# Patient Record
Sex: Female | Born: 1940 | Race: White | Hispanic: No | Marital: Married | Smoking: Never smoker
Health system: Southern US, Community
[De-identification: ages and names within clinical notes are randomized; demographics above are authoritative.]

## PROBLEM LIST (undated history)

## (undated) DIAGNOSIS — E079 Disorder of thyroid, unspecified: Secondary | ICD-10-CM

## (undated) DIAGNOSIS — I10 Essential (primary) hypertension: Secondary | ICD-10-CM

---

## 2013-10-06 ENCOUNTER — Encounter: Payer: Self-pay | Admitting: Emergency Medicine

## 2013-10-06 ENCOUNTER — Emergency Department
Admission: EM | Admit: 2013-10-06 | Discharge: 2013-10-06 | Disposition: A | Discharge status: Home / Self Care | Payer: Medicare HMO | Source: Home / Self Care | Attending: Emergency Medicine | Admitting: Emergency Medicine

## 2013-10-06 ENCOUNTER — Emergency Department (INDEPENDENT_AMBULATORY_CARE_PROVIDER_SITE_OTHER): Payer: Commercial Managed Care - HMO

## 2013-10-06 DIAGNOSIS — R0989 Other specified symptoms and signs involving the circulatory and respiratory systems: Secondary | ICD-10-CM

## 2013-10-06 DIAGNOSIS — R05 Cough: Secondary | ICD-10-CM

## 2013-10-06 DIAGNOSIS — J0100 Acute maxillary sinusitis, unspecified: Secondary | ICD-10-CM

## 2013-10-06 DIAGNOSIS — R059 Cough, unspecified: Secondary | ICD-10-CM

## 2013-10-06 DIAGNOSIS — J209 Acute bronchitis, unspecified: Secondary | ICD-10-CM

## 2013-10-06 HISTORY — DX: Essential (primary) hypertension: I10

## 2013-10-06 HISTORY — DX: Disorder of thyroid, unspecified: E07.9

## 2013-10-06 MED ORDER — PROMETHAZINE-CODEINE 6.25-10 MG/5ML PO SYRP: ORAL_SOLUTION | ORAL | Status: DC

## 2013-10-06 MED ORDER — AZITHROMYCIN 250 MG PO TABS: ORAL_TABLET | ORAL | Status: DC

## 2013-10-06 MED ORDER — METHYLPREDNISOLONE ACETATE 80 MG/ML IJ SUSP
80.0000 mg | Freq: Once | INTRAMUSCULAR | Status: AC
  Administered 2013-10-06: 80 mg via INTRAMUSCULAR

## 2013-10-06 MED ORDER — CEFTRIAXONE SODIUM 1 G IJ SOLR
1.0000 g | INTRAMUSCULAR | Status: AC
  Administered 2013-10-06: 1 g via INTRAMUSCULAR

## 2013-10-06 MED ORDER — FLUTICASONE PROPIONATE 50 MCG/ACT NA SUSP: NASAL | Status: AC

## 2013-10-06 NOTE — ED Provider Notes (Signed)
CSN: [id number]     Arrival date & time 10/06/13  1237 History   First MD Initiated Contact with Patient 10/06/13 1256     Chief Complaint  Patient presents with  . Sinus Problem   HPI Sinus pain, pressure, runny nose, cough, wheezing, sinus headache x 10 days. Does not smoke but lives in a smoking household.   Have been using over-the-counter treatment which helps a little bit.  No chills/sweats +  Low-grade Fever  +  Nasal congestion +  Discolored Post-nasal drainage Moderate to severe bilateral sinus pain/pressure Mild sore throat  +  Hacking cough, productive of clear sputum with occasional green sputum  + Mild wheezing Positive chest congestion No hemoptysis No shortness of breath No pleuritic pain  No itchy/red eyes No earache  No nausea No vomiting No abdominal pain No diarrhea  No skin rashes +  Fatigue + myalgias +sinus headache   Past Medical History  Diagnosis Date  . Thyroid disease   . Hypertension    History reviewed. No pertinent past surgical history. No family history on file. History  Substance Use Topics  . Smoking status: Passive Smoke Exposure - Never Smoker  . Smokeless tobacco: Not on file  . Alcohol Use: Yes   OB History   Grav Para Term Preterm Abortions TAB SAB Ect Mult Living                 Review of Systems  All other systems reviewed and are negative.   Allergies  Review of patient's allergies indicates no known allergies.  Home Medications   Prior to Admission medications   Medication Sig Start Date End Date Taking? Authorizing Provider  cetirizine (ZYRTEC) 10 MG tablet Take 10 mg by mouth daily.   Yes Historical Provider, MD  levothyroxine (SYNTHROID, LEVOTHROID) 50 MCG tablet Take 50 mcg by mouth daily before breakfast.   Yes Historical Provider, MD  montelukast (SINGULAIR) 10 MG tablet Take 10 mg by mouth at bedtime.   Yes Historical Provider, MD  azithromycin (ZITHROMAX Z-PAK) 250 MG tablet Take 2 tablets on day  one, then 1 tablet daily on days 2 through 5 10/06/13   Lajean Manes, MD  fluticasone Aleda Grana) 50 MCG/ACT nasal spray 1 or 2 sprays each nostril twice a day 10/06/13   Lajean Manes, MD  promethazine-codeine [facility] WITH CODEINE) 6.25-10 MG/5ML syrup Take 1 teaspoon at bedtime as needed for cough. May cause drowsiness. 10/06/13   Lajean Manes, MD   BP 140/82  Pulse 80  Temp(Src) 98.1 F (36.7 C) (Oral)  Ht  (1.575 m)  Wt 172 lb (78.019 kg)  BMI 31.45 kg/m2  SpO2 97% Physical Exam  Nursing note and vitals reviewed. Constitutional: She is oriented to person, place, and time. She appears well-developed and well-nourished. No distress.  HENT:  Head: Normocephalic and atraumatic.  Right Ear: Tympanic membrane, external ear and ear canal normal.  Left Ear: Tympanic membrane, external ear and ear canal normal.  Nose: Mucosal edema and rhinorrhea present. Right sinus exhibits maxillary sinus tenderness. Left sinus exhibits maxillary sinus tenderness.  Mouth/Throat: Oropharynx is clear and moist. No oral lesions. No oropharyngeal exudate.  Eyes: Right eye exhibits no discharge. Left eye exhibits no discharge. No scleral icterus.  Neck: Neck supple.  Cardiovascular: Normal rate, regular rhythm and normal heart sounds.   Pulmonary/Chest: Effort normal. She has no decreased breath sounds. She has wheezes (minimal). She has rhonchi (Diffuse). She has no rales.  Minimal bibasilar rales which clear  after coughing  Lymphadenopathy:    She has no cervical adenopathy.  Neurological: She is alert and oriented to person, place, and time. No cranial nerve deficit.  Skin: Skin is warm and dry.  Psychiatric: She has a normal mood and affect.    ED Course  Procedures (including critical care time) Labs Review Labs Reviewed - No data to display  Imaging Review Dg Chest 2 View  10/06/2013   CLINICAL DATA:  Cough and congestion.  EXAM: CHEST  2 VIEW  COMPARISON:  None.  FINDINGS: Lungs are clear.  Heart size is normal. No pneumothorax or pleural effusion. Surgical clips in the left upper quadrant of the abdomen are noted.  IMPRESSION: No acute disease.   Electronically Signed   By: Drusilla Kanner M.D.   On: 10/06/2013 13:47     MDM   1. Acute bronchitis with bronchospasm   2. Acute maxillary sinusitis, recurrence not specified     Likely has acute bronchitis and acute sinusitis. No significant wheezing at this time. Good oxygenation with O2 saturation 97% on room air. She is exposed to secondhand smoke and advised her to avoid secondhand smoke  Treatment options discussed, as well as risks, benefits, alternatives. Patient voiced understanding and agreement with the following plans: An After Visit Summary was printed and given to the patient. Tylenol as needed for pain or fever. Today, we gave you a shot of Depo-Medrol, which is a cortisone type medication. We also gave a shot of Rocephin, a strong antibiotic. Take the prescriptions as prescribed.--Z. PAK and Flonase and OTC Mucinex  May also take over-the-counter Mucinex twice a day. Today, chest x-ray shows bronchitis, but no pneumonia. Do not allow smoking in the house !  Acute Bronchitis Bronchitis is when the airways that extend from the windpipe into the lungs get red, puffy, and painful (inflamed). Bronchitis often causes thick spit (mucus) to develop. This leads to a cough. A cough is the most common symptom of bronchitis. In acute bronchitis, the condition usually begins suddenly and goes away over time (usually in 2 weeks). Smoking, allergies, and asthma can make bronchitis worse. Repeated episodes of bronchitis may cause more lung problems. HOME CARE  Rest.  Drink enough fluids to keep your pee (urine) clear or pale yellow (unless you need to limit fluids as told by your doctor).  Only take over-the-counter or prescription medicines as told by your doctor.  Avoid smoking and secondhand smoke. These can make  bronchitis worse. If you are a smoker, think about using nicotine gum or skin patches. Quitting smoking will help your lungs heal faster.  Reduce the chance of getting bronchitis again by:  Washing your hands often.  Avoiding people with cold symptoms.  Trying not to touch your hands to your mouth, nose, or eyes.  Follow up with your primary care doctor at [facility] family medicine within one week. GET HELP IF: Your symptoms do not improve after 1 week of treatment. Symptoms include:  Cough.  Fever.  Coughing up thick spit.  Body aches.  Chest congestion.  Chills.  Shortness of breath.  Sore throat. GET HELP RIGHT AWAY IF:   You have an increased fever.  You have chills.  You have severe shortness of breath.  You have bloody thick spit (sputum).  You throw up (vomit) often.  You lose too much body fluid (dehydration).  You have a severe headache.  You faint.  Precautions discussed. Red flags discussed. Questions invited and answered. Patient voiced  understanding and agreement.   Lajean Manes, MD 10/06/13 1434

## 2013-10-06 NOTE — Discharge Instructions (Signed)
Today, we gave you a shot of Depo-Medrol, which is a cortisone type medication. We also gave a shot of Rocephin, a strong antibiotic.  Take the prescriptions as prescribed. May also take over-the-counter Mucinex twice a day. Today, chest x-ray shows bronchitis, but no pneumonia.    Do not allow smoking in the house !  Acute Bronchitis Bronchitis is when the airways that extend from the windpipe into the lungs get red, puffy, and painful (inflamed). Bronchitis often causes thick spit (mucus) to develop. This leads to a cough. A cough is the most common symptom of bronchitis. In acute bronchitis, the condition usually begins suddenly and goes away over time (usually in 2 weeks). Smoking, allergies, and asthma can make bronchitis worse. Repeated episodes of bronchitis may cause more lung problems. HOME CARE  Rest.  Drink enough fluids to keep your pee (urine) clear or pale yellow (unless you need to limit fluids as told by your doctor).  Only take over-the-counter or prescription medicines as told by your doctor.  Avoid smoking and secondhand smoke. These can make bronchitis worse. If you are a smoker, think about using nicotine gum or skin patches. Quitting smoking will help your lungs heal faster.  Reduce the chance of getting bronchitis again by:  Washing your hands often.  Avoiding people with cold symptoms.  Trying not to touch your hands to your mouth, nose, or eyes.  Follow up with your primary care doctor at [facility] family medicine within one week. GET HELP IF: Your symptoms do not improve after 1 week of treatment. Symptoms include:  Cough.  Fever.  Coughing up thick spit.  Body aches.  Chest congestion.  Chills.  Shortness of breath.  Sore throat. GET HELP RIGHT AWAY IF:   You have an increased fever.  You have chills.  You have severe shortness of breath.  You have bloody thick spit (sputum).  You throw up (vomit) often.  You lose too much  body fluid (dehydration).  You have a severe headache.  You faint. MAKE SURE YOU:   Understand these instructions.  Will watch your condition.  Will get help right away if you are not doing well or get worse.

## 2013-10-06 NOTE — ED Notes (Signed)
Sinus pain, pressure, runny nose, cough, wheezing, sinus headache x 10 days. Does not smoke but lives in a smoking household.

## 2014-01-11 ENCOUNTER — Encounter: Payer: Self-pay | Admitting: *Deleted

## 2014-01-11 ENCOUNTER — Emergency Department
Admission: EM | Admit: 2014-01-11 | Discharge: 2014-01-11 | Disposition: A | Discharge status: Home / Self Care | Payer: Medicare HMO | Source: Home / Self Care | Attending: Family Medicine | Admitting: Family Medicine

## 2014-01-11 DIAGNOSIS — J069 Acute upper respiratory infection, unspecified: Secondary | ICD-10-CM

## 2014-01-11 DIAGNOSIS — J029 Acute pharyngitis, unspecified: Secondary | ICD-10-CM

## 2014-01-11 MED ORDER — PREDNISONE 10 MG PO TABS: 30.0000 mg | ORAL_TABLET | Freq: Every day | ORAL | Status: AC

## 2014-01-11 MED ORDER — AZITHROMYCIN 250 MG PO TABS: 250.0000 mg | ORAL_TABLET | Freq: Every day | ORAL | Status: AC

## 2014-01-11 MED ORDER — IPRATROPIUM BROMIDE 0.06 % NA SOLN: 2.0000 | Freq: Four times a day (QID) | NASAL | Status: AC

## 2014-01-11 NOTE — ED Provider Notes (Signed)
Nicole Fuentes is a 73 y.o. female who presents to Urgent Care today for cough congestion sinus pressure at discharge and sore throat associated with a headache. Symptoms present off and on for about 4 weeks. Patient has tried Zyrtec Singulair and Tylenol which has not worked well. No vomiting or diarrhea. She feels well otherwise. No chest pains or palpitations.   Past Medical History  Diagnosis Date  . Thyroid disease   . Hypertension    History reviewed. No pertinent past surgical history. History  Substance Use Topics  . Smoking status: Passive Smoke Exposure - Never Smoker  . Smokeless tobacco: Not on file  . Alcohol Use: Yes   ROS as above Medications: No current facility-administered medications for this encounter.   Current Outpatient Prescriptions  Medication Sig Dispense Refill  . azithromycin (ZITHROMAX) 250 MG tablet Take 1 tablet (250 mg total) by mouth daily. Take first 2 tablets together, then 1 every day until finished. 6 tablet 0  . cetirizine (ZYRTEC) 10 MG tablet Take 10 mg by mouth daily.    . fluticasone (FLONASE) 50 MCG/ACT nasal spray 1 or 2 sprays each nostril twice a day 16 g 0  . ipratropium (ATROVENT) 0.06 % nasal spray Place 2 sprays into both nostrils 4 (four) times daily. 15 mL 1  . levothyroxine (SYNTHROID, LEVOTHROID) 50 MCG tablet Take 50 mcg by mouth daily before breakfast.    . montelukast (SINGULAIR) 10 MG tablet Take 10 mg by mouth at bedtime.    . predniSONE (DELTASONE) 10 MG tablet Take 3 tablets (30 mg total) by mouth daily. 15 tablet 0   No Known Allergies   Exam:  BP 150/79 mmHg  Pulse 98  Temp(Src) 98.1 F (36.7 C) (Oral)  Resp 18  Wt 174 lb (78.926 kg)  SpO2 98% Gen: Well NAD HEENT: EOMI,  MMM posterior pharynx erythematous with cobblestoning. Normal tympanic membranes bilaterally. Lungs: Normal work of breathing. CTABL Heart: RRR no MRG Abd: NABS, Soft. Nondistended, Nontender Exts: Brisk capillary refill, warm and well  perfused.   No results found for this or any previous visit (from the past 24 hour(s)). No results found.  Assessment and Plan: 73 y.o. female with viral upper respiratory infection with pharyngitis and otitis. Plan to treat with prednisone and Atrovent nasal spray. Reserve azithromycin for use if not improved. Follow-up as needed.  Discussed warning signs or symptoms. Please see discharge instructions. Patient expresses understanding.     Rodolph BongEvan S Leiana Rund, MD 01/11/14 [phone]

## 2014-01-11 NOTE — Discharge Instructions (Signed)
Thank you for coming in today. Continue Zyrtec and Singulair. Use Tylenol every 6 hours for pain. Use Atrovent nasal spray as directed to reduce to postnasal drip. Take prednisone daily for 5 days to combat throat pain in her pain and nasal congestion. If not getting better take azithromycin antibiotics Call or go to the emergency room if you get worse, have trouble breathing, have chest pains, or palpitations.   Pharyngitis Pharyngitis is redness, pain, and swelling (inflammation) of your pharynx.  CAUSES  Pharyngitis is usually caused by infection. Most of the time, these infections are from viruses (viral) and are part of a cold. However, sometimes pharyngitis is caused by bacteria (bacterial). Pharyngitis can also be caused by allergies. Viral pharyngitis may be spread from person to person by coughing, sneezing, and personal items or utensils (cups, forks, spoons, toothbrushes). Bacterial pharyngitis may be spread from person to person by more intimate contact, such as kissing.  SIGNS AND SYMPTOMS  Symptoms of pharyngitis include:   Sore throat.   Tiredness (fatigue).   Low-grade fever.   Headache.  Joint pain and muscle aches.  Skin rashes.  Swollen lymph nodes.  Plaque-like film on throat or tonsils (often seen with bacterial pharyngitis). DIAGNOSIS  Your health care provider will ask you questions about your illness and your symptoms. Your medical history, along with a physical exam, is often all that is needed to diagnose pharyngitis. Sometimes, a rapid strep test is done. Other lab tests may also be done, depending on the suspected cause.  TREATMENT  Viral pharyngitis will usually get better in 3-4 days without the use of medicine. Bacterial pharyngitis is treated with medicines that kill germs (antibiotics).  HOME CARE INSTRUCTIONS   Drink enough water and fluids to keep your urine clear or pale yellow.   Only take over-the-counter or prescription medicines as  directed by your health care provider:   If you are prescribed antibiotics, make sure you finish them even if you start to feel better.   Do not take aspirin.   Get lots of rest.   Gargle with 8 oz of salt water ( tsp of salt per 1 qt of water) as often as every 1-2 hours to soothe your throat.   Throat lozenges (if you are not at risk for choking) or sprays may be used to soothe your throat. SEEK MEDICAL CARE IF:   You have large, tender lumps in your neck.  You have a rash.  You cough up green, yellow-brown, or bloody spit. SEEK IMMEDIATE MEDICAL CARE IF:   Your neck becomes stiff.  You drool or are unable to swallow liquids.  You vomit or are unable to keep medicines or liquids down.  You have severe pain that does not go away with the use of recommended medicines.  You have trouble breathing (not caused by a stuffy nose). MAKE SURE YOU:   Understand these instructions.  Will watch your condition.  Will get help right away if you are not doing well or get worse. Document Released: 12/29/2004 Document Revised: 10/19/2012 Document Reviewed: 09/05/2012 [facility] Patient Information 2015 [place], [place]. This information is not intended to replace advice given to you by your health care provider. Make sure you discuss any questions you have with your health care provider.

## 2015-09-14 IMAGING — CR DG CHEST 2V
2 series · 2 of 2 positions shown · non-contrast
Comparison: None.

CLINICAL DATA: Cough and congestion.

EXAM:
CHEST  2 VIEW

[view not recorded (1 of 2)]
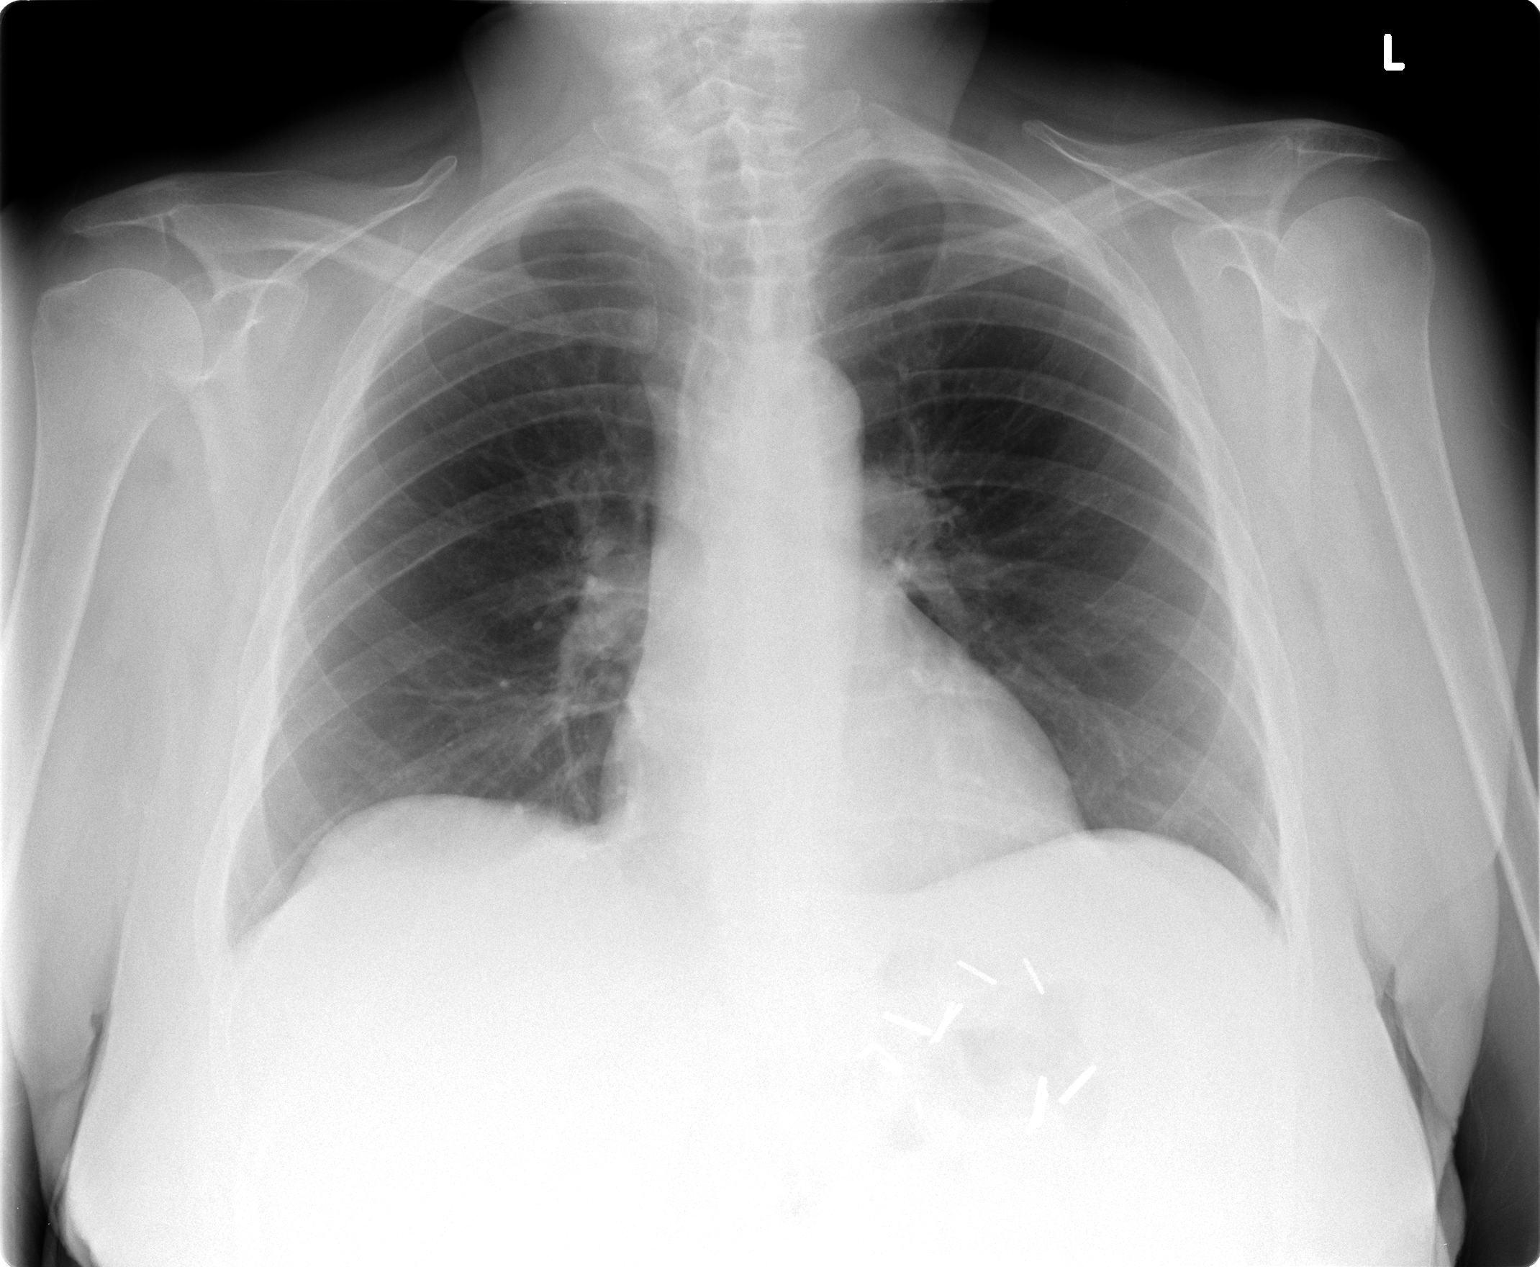

[view not recorded (2 of 2)]
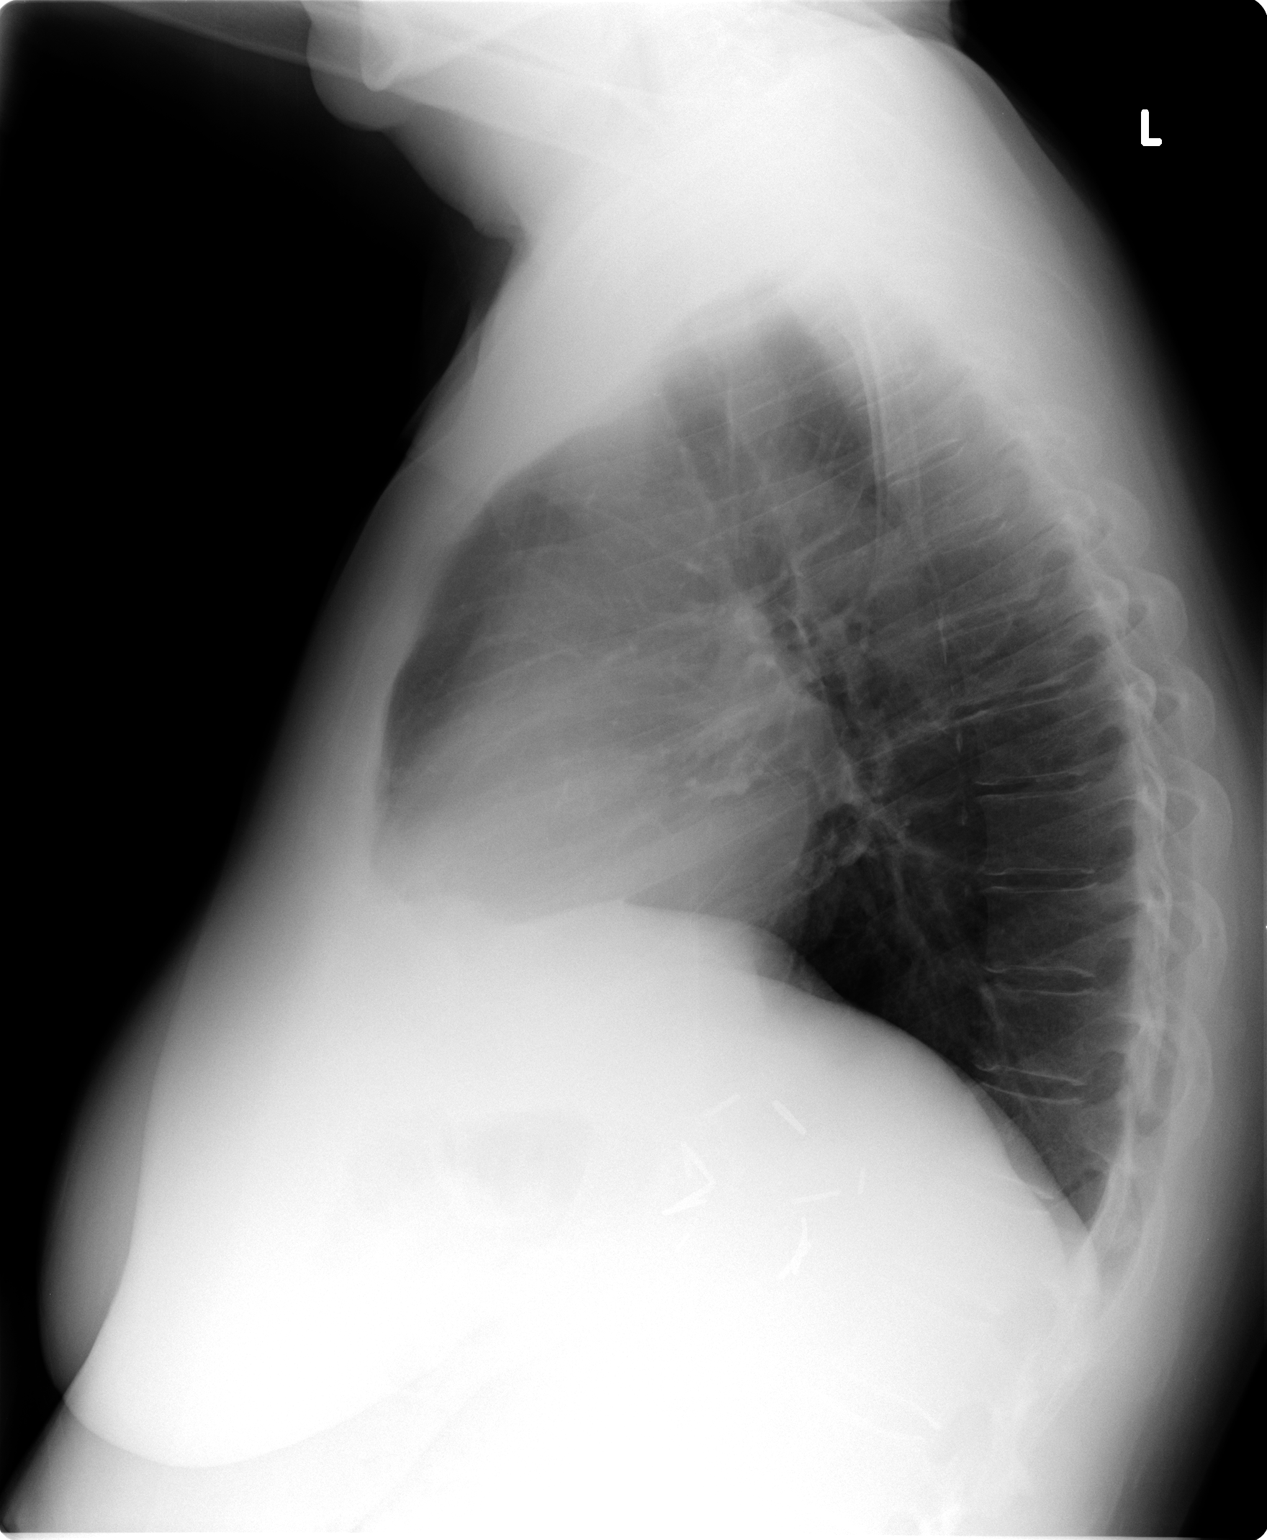

[2 of 2 positions shown; findings below may reference images not displayed]

FINDINGS: Lungs are clear. Heart size is normal. No pneumothorax or pleural
effusion. Surgical clips in the left upper quadrant of the abdomen
are noted.
IMPRESSION: No acute disease.

## 2021-01-12 ENCOUNTER — Emergency Department (INDEPENDENT_AMBULATORY_CARE_PROVIDER_SITE_OTHER)
Admission: EM | Admit: 2021-01-12 | Discharge: 2021-01-12 | Disposition: A | Discharge status: Home / Self Care | Payer: Medicare HMO | Source: Home / Self Care

## 2021-01-12 ENCOUNTER — Other Ambulatory Visit: Payer: Self-pay

## 2021-01-12 DIAGNOSIS — H6693 Otitis media, unspecified, bilateral: Secondary | ICD-10-CM | POA: Diagnosis not present

## 2021-01-12 MED ORDER — AMOXICILLIN-POT CLAVULANATE 875-125 MG PO TABS
1.0000 | ORAL_TABLET | Freq: Two times a day (BID) | ORAL | 0 refills | Status: AC
Start: 2021-01-12 — End: 2021-01-22

## 2021-01-12 NOTE — ED Provider Notes (Signed)
Nicole Fuentes CARE    CSN: [id number] Arrival date & time: 01/12/21  1415      History   Chief Complaint Chief Complaint  Patient presents with   Ear Pain    Bilateral ear pain, coughing and chest congestion x3 days    HPI Nicole Fuentes is a 81 y.o. female.   HPI 81 year old female presents with bilateral ear pain coughing and chest congestion for 3 days.  Patient is not vaccinated for COVID-19 or influenza.  Patient is extremely hard of hearing.  Past Medical History:  Diagnosis Date   Hypertension    Thyroid disease     There are no problems to display for this patient.   History reviewed. No pertinent surgical history.  OB History   No obstetric history on file.      Home Medications    Prior to Admission medications   Medication Sig Start Date End Date Taking? Authorizing Provider  amoxicillin-clavulanate (AUGMENTIN) 875-125 MG tablet Take 1 tablet by mouth every 12 (twelve) hours for 10 days. 01/12/21 01/22/21 Yes Eliezer Lofts, FNP  azithromycin (ZITHROMAX) 250 MG tablet Take 1 tablet (250 mg total) by mouth daily. Take first 2 tablets together, then 1 every day until finished. 01/11/14  Yes Gregor Hams, MD  cetirizine (ZYRTEC) 10 MG tablet Take 10 mg by mouth daily.   Yes [provider]  cetirizine (ZYRTEC) 10 MG tablet Take 1 tablet by mouth daily. 07/31/20  Yes [provider]  DULoxetine (CYMBALTA) 60 MG capsule Take by mouth. 01/02/21 04/02/21 Yes [provider]  fluticasone (FLONASE) 50 MCG/ACT nasal spray 1 or 2 sprays each nostril twice a day 10/06/13  Yes Jacqulyn Cane, MD  furosemide (LASIX) 20 MG tablet Take by mouth. 11/18/20  Yes [provider]  ipratropium (ATROVENT) 0.06 % nasal spray Place 2 sprays into both nostrils 4 (four) times daily. 01/11/14  Yes Gregor Hams, MD  levothyroxine (SYNTHROID) 75 MCG tablet Take 1 tablet by mouth daily. 10/21/20  Yes [provider]  levothyroxine  (SYNTHROID, LEVOTHROID) 50 MCG tablet Take 50 mcg by mouth daily before breakfast.   Yes [provider]  montelukast (SINGULAIR) 10 MG tablet Take 10 mg by mouth at bedtime.   Yes [provider]  montelukast (SINGULAIR) 10 MG tablet Take 1 tablet by mouth at bedtime. 04/24/20  Yes [provider]  pantoprazole (PROTONIX) 40 MG tablet Take 1 tablet by mouth daily. 11/01/20  Yes [provider]  pravastatin (PRAVACHOL) 40 MG tablet Take 1 tablet by mouth at bedtime. 10/24/15  Yes [provider]  predniSONE (DELTASONE) 10 MG tablet Take 3 tablets (30 mg total) by mouth daily. 01/11/14  Yes Gregor Hams, MD    Family History History reviewed. No pertinent family history.  Social History Social History   Tobacco Use   Smoking status: Never    Passive exposure: Yes   Smokeless tobacco: Never  Substance Use Topics   Alcohol use: Not Currently   Drug use: No     Allergies   Prednisone and Carisoprodol   Review of Systems Review of Systems  HENT:  Positive for ear discharge.   Respiratory:  Positive for cough.        Chest congestion x4 days  All other systems reviewed and are negative.   Physical Exam Triage Vital Signs ED Triage Vitals  Enc Vitals Group     BP 01/12/21 1448 (!) 149/81     Pulse Rate  01/12/21 1448 71     Resp 01/12/21 1448 18     Temp 01/12/21 1448 98.8 F (37.1 C)     Temp Source 01/12/21 1448 Oral     SpO2 01/12/21 1448 97 %     Weight 01/12/21 1443 178 lb (80.7 kg)     Height 01/12/21 1443 5\' 2"  (1.575 m)     Head Circumference --      Peak Flow --      Pain Score 01/12/21 1443 5     Pain Loc --      Pain Edu? --      Excl. in [place]? --    No data found.  Updated Vital Signs BP (!) 149/81 (BP Location: Right Arm)    Pulse 71    Temp 98.8 F (37.1 C) (Oral)    Resp 18    Ht 5\' 2"  (1.575 m)    Wt 178 lb (80.7 kg)    SpO2 97%    BMI 32.56 kg/m       Physical Exam Vitals and nursing note reviewed.   Constitutional:      Appearance: Normal appearance. She is obese.  HENT:     Right Ear: Ear canal and external ear normal. Tympanic membrane is erythematous and retracted.     Left Ear: Ear canal and external ear normal. Tympanic membrane is erythematous and bulging.     Ears:     Comments: Extremely hard of hearing bilaterally    Mouth/Throat:     Mouth: Mucous membranes are moist.     Pharynx: Oropharynx is clear.  Eyes:     Extraocular Movements: Extraocular movements intact.     Conjunctiva/sclera: Conjunctivae normal.     Pupils: Pupils are equal, round, and reactive to light.  Cardiovascular:     Rate and Rhythm: Normal rate and regular rhythm.     Pulses: Normal pulses.     Heart sounds: Normal heart sounds.  Pulmonary:     Effort: Pulmonary effort is normal.     Breath sounds: Normal breath sounds.  Musculoskeletal:     Cervical back: Normal range of motion and neck supple.  Skin:    General: Skin is warm and dry.  Neurological:     General: No focal deficit present.     Mental Status: She is alert and oriented to person, place, and time.     UC Treatments / Results  Labs (all labs ordered are listed, but only abnormal results are displayed) Labs Reviewed - No data to display  EKG   Radiology No results found.  Procedures Procedures (including critical care time)  Medications Ordered in UC Medications - No data to display  Initial Impression / Assessment and Plan / UC Course  I have reviewed the triage vital signs and the nursing notes.  Pertinent labs & imaging results that were available during my care of the patient were reviewed by me and considered in my medical decision making (see chart for details).     MDM: 1.  Acute bilateral otitis media-Rx'd Augmentin. Advised patient to take medications as directed.  Encouraged patient increase daily water intake while taking these medications.  Patient discharged home, hemodynamically stable. Final  Clinical Impressions(s) / UC Diagnoses   Final diagnoses:  Acute bilateral otitis media     Discharge Instructions      Advised patient to take medications as directed.  Encouraged patient increase daily water intake while taking these medications.  ED Prescriptions     Medication Sig Dispense Auth. Provider   amoxicillin-clavulanate (AUGMENTIN) 875-125 MG tablet Take 1 tablet by mouth every 12 (twelve) hours for 10 days. 20 tablet Eliezer Lofts, FNP      PDMP not reviewed this encounter.   Eliezer Lofts, FNP 01/12/21 1520

## 2021-01-12 NOTE — Discharge Instructions (Addendum)
Advised patient to take medications as directed.  Encouraged patient increase daily water intake while taking these medications.

## 2021-01-12 NOTE — ED Triage Notes (Signed)
Pt states that she has some bilateral ear pain, coughing and chest congestion. X4 days   Pt states that she isn't vaccinated for covid. Pt states that she hasn't had flu vaccine.
